# Patient Record
Sex: Male | Born: 1955 | Race: Asian | Hispanic: No | Marital: Married | Smoking: Never smoker
Health system: Southern US, Community
[De-identification: ages and names within clinical notes are randomized; demographics above are authoritative.]

## PROBLEM LIST (undated history)

## (undated) DIAGNOSIS — I10 Essential (primary) hypertension: Secondary | ICD-10-CM

---

## 2017-02-16 ENCOUNTER — Other Ambulatory Visit: Payer: Self-pay

## 2017-02-16 ENCOUNTER — Emergency Department (HOSPITAL_COMMUNITY)
Admission: EM | Admit: 2017-02-16 | Discharge: 2017-02-16 | Disposition: A | Discharge status: Home / Self Care | Payer: No Typology Code available for payment source | Attending: Emergency Medicine | Admitting: Emergency Medicine

## 2017-02-16 ENCOUNTER — Encounter (HOSPITAL_COMMUNITY): Payer: Self-pay

## 2017-02-16 ENCOUNTER — Emergency Department (HOSPITAL_COMMUNITY): Payer: No Typology Code available for payment source

## 2017-02-16 DIAGNOSIS — Y999 Unspecified external cause status: Secondary | ICD-10-CM | POA: Insufficient documentation

## 2017-02-16 DIAGNOSIS — M25512 Pain in left shoulder: Secondary | ICD-10-CM | POA: Diagnosis not present

## 2017-02-16 DIAGNOSIS — Y929 Unspecified place or not applicable: Secondary | ICD-10-CM | POA: Insufficient documentation

## 2017-02-16 DIAGNOSIS — S20212A Contusion of left front wall of thorax, initial encounter: Secondary | ICD-10-CM | POA: Diagnosis not present

## 2017-02-16 DIAGNOSIS — Y939 Activity, unspecified: Secondary | ICD-10-CM | POA: Diagnosis not present

## 2017-02-16 DIAGNOSIS — Z041 Encounter for examination and observation following transport accident: Secondary | ICD-10-CM | POA: Diagnosis present

## 2017-02-16 HISTORY — DX: Essential (primary) hypertension: I10

## 2017-02-16 MED ORDER — CYCLOBENZAPRINE HCL 10 MG PO TABS
10.0000 mg | ORAL_TABLET | Freq: Once | ORAL | Status: AC
  Administered 2017-02-16: 10 mg via ORAL
  Filled 2017-02-16: qty 1

## 2017-02-16 MED ORDER — DICLOFENAC SODIUM 50 MG PO TBEC: 50.0000 mg | DELAYED_RELEASE_TABLET | Freq: Two times a day (BID) | ORAL | 0 refills | Status: AC

## 2017-02-16 MED ORDER — CYCLOBENZAPRINE HCL 10 MG PO TABS: 10.0000 mg | ORAL_TABLET | Freq: Two times a day (BID) | ORAL | 0 refills | Status: AC | PRN
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
## 2017-02-16 NOTE — Discharge Instructions (Signed)
Do not take the muscle relaxer if driving as it will make you sleepy. Follow up with your doctor or return here if symptoms worsen. °
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
